# Patient Record
Sex: Female | Born: 1935 | Race: White | Hispanic: No
Health system: Southern US, Community
[De-identification: ages and names within clinical notes are randomized; demographics above are authoritative.]

---

## 2004-08-23 ENCOUNTER — Ambulatory Visit: Payer: Self-pay | Admitting: Internal Medicine

## 2005-01-10 ENCOUNTER — Ambulatory Visit: Payer: Self-pay | Admitting: Gastroenterology

## 2006-04-23 ENCOUNTER — Ambulatory Visit: Payer: Self-pay | Admitting: Nephrology

## 2009-11-03 ENCOUNTER — Observation Stay: Payer: Self-pay | Admitting: Specialist

## 2009-11-18 ENCOUNTER — Ambulatory Visit: Payer: Self-pay | Admitting: Internal Medicine

## 2013-10-02 LAB — CBC WITH DIFFERENTIAL/PLATELET
BASOS PCT: 0.9 %
Basophil #: 0 10*3/uL (ref 0.0–0.1)
Eosinophil #: 0.1 10*3/uL (ref 0.0–0.7)
Eosinophil %: 1.4 %
HCT: 34.6 % — AB (ref 35.0–47.0)
HGB: 11.2 g/dL — AB (ref 12.0–16.0)
LYMPHS ABS: 0.7 10*3/uL — AB (ref 1.0–3.6)
LYMPHS PCT: 13.1 %
MCH: 31.3 pg (ref 26.0–34.0)
MCHC: 32.4 g/dL (ref 32.0–36.0)
MCV: 97 fL (ref 80–100)
MONO ABS: 0.5 x10 3/mm (ref 0.2–0.9)
MONOS PCT: 9 %
Neutrophil #: 4.3 10*3/uL (ref 1.4–6.5)
Neutrophil %: 75.6 %
Platelet: 262 10*3/uL (ref 150–440)
RBC: 3.58 10*6/uL — ABNORMAL LOW (ref 3.80–5.20)
RDW: 15.3 % — ABNORMAL HIGH (ref 11.5–14.5)
WBC: 5.7 10*3/uL (ref 3.6–11.0)

## 2013-10-02 LAB — COMPREHENSIVE METABOLIC PANEL
ALK PHOS: 109 U/L
Albumin: 3.6 g/dL (ref 3.4–5.0)
Anion Gap: 8 (ref 7–16)
BILIRUBIN TOTAL: 0.4 mg/dL (ref 0.2–1.0)
BUN: 59 mg/dL — AB (ref 7–18)
CALCIUM: 7.8 mg/dL — AB (ref 8.5–10.1)
CHLORIDE: 118 mmol/L — AB (ref 98–107)
Co2: 15 mmol/L — ABNORMAL LOW (ref 21–32)
Creatinine: 2.88 mg/dL — ABNORMAL HIGH (ref 0.60–1.30)
EGFR (Non-African Amer.): 13 — ABNORMAL LOW
GFR CALC AF AMER: 15 — AB
Glucose: 93 mg/dL (ref 65–99)
OSMOLALITY: 297 (ref 275–301)
POTASSIUM: 4.9 mmol/L (ref 3.5–5.1)
SGOT(AST): 23 U/L (ref 15–37)
SGPT (ALT): 17 U/L (ref 12–78)
Sodium: 141 mmol/L (ref 136–145)
Total Protein: 7.8 g/dL (ref 6.4–8.2)

## 2013-10-02 LAB — PRO B NATRIURETIC PEPTIDE: B-TYPE NATIURETIC PEPTID: 13684 pg/mL — AB (ref 0–450)

## 2013-10-03 ENCOUNTER — Observation Stay: Payer: Self-pay | Admitting: Internal Medicine

## 2013-12-01 ENCOUNTER — Emergency Department: Payer: Self-pay | Admitting: Emergency Medicine

## 2013-12-01 LAB — BASIC METABOLIC PANEL
Anion Gap: 13 (ref 7–16)
BUN: 52 mg/dL — AB (ref 7–18)
Calcium, Total: 8.3 mg/dL — ABNORMAL LOW (ref 8.5–10.1)
Chloride: 112 mmol/L — ABNORMAL HIGH (ref 98–107)
Co2: 11 mmol/L — ABNORMAL LOW (ref 21–32)
Creatinine: 2.87 mg/dL — ABNORMAL HIGH (ref 0.60–1.30)
EGFR (Non-African Amer.): 13 — ABNORMAL LOW
GFR CALC AF AMER: 16 — AB
Glucose: 100 mg/dL — ABNORMAL HIGH (ref 65–99)
Osmolality: 286 (ref 275–301)
POTASSIUM: 4.2 mmol/L (ref 3.5–5.1)
Sodium: 136 mmol/L (ref 136–145)

## 2013-12-01 LAB — CBC
HCT: 36.7 % (ref 35.0–47.0)
HGB: 11.6 g/dL — ABNORMAL LOW (ref 12.0–16.0)
MCH: 30.6 pg (ref 26.0–34.0)
MCHC: 31.6 g/dL — AB (ref 32.0–36.0)
MCV: 97 fL (ref 80–100)
PLATELETS: 182 10*3/uL (ref 150–440)
RBC: 3.79 10*6/uL — ABNORMAL LOW (ref 3.80–5.20)
RDW: 16 % — AB (ref 11.5–14.5)
WBC: 3.9 10*3/uL (ref 3.6–11.0)

## 2013-12-01 LAB — PRO B NATRIURETIC PEPTIDE: B-TYPE NATIURETIC PEPTID: 18578 pg/mL — AB (ref 0–450)

## 2013-12-01 LAB — TROPONIN I: Troponin-I: <0.02 ng/mL

## 2014-07-13 DIAGNOSIS — I150 Renovascular hypertension: Secondary | ICD-10-CM | POA: Diagnosis not present

## 2014-07-13 DIAGNOSIS — K219 Gastro-esophageal reflux disease without esophagitis: Secondary | ICD-10-CM | POA: Diagnosis not present

## 2014-07-13 DIAGNOSIS — E039 Hypothyroidism, unspecified: Secondary | ICD-10-CM | POA: Diagnosis not present

## 2014-07-13 DIAGNOSIS — E871 Hypo-osmolality and hyponatremia: Secondary | ICD-10-CM | POA: Diagnosis not present

## 2014-07-13 DIAGNOSIS — E782 Mixed hyperlipidemia: Secondary | ICD-10-CM | POA: Diagnosis not present

## 2014-07-13 DIAGNOSIS — K5909 Other constipation: Secondary | ICD-10-CM | POA: Diagnosis not present

## 2014-07-13 DIAGNOSIS — Z0001 Encounter for general adult medical examination with abnormal findings: Secondary | ICD-10-CM | POA: Diagnosis not present

## 2014-09-07 ENCOUNTER — Ambulatory Visit
Admit: 2014-09-07 | Disposition: A | Discharge status: Home / Self Care | Payer: Self-pay | Attending: Internal Medicine | Admitting: Internal Medicine

## 2014-09-30 ENCOUNTER — Inpatient Hospital Stay: Payer: Self-pay | Admitting: Internal Medicine

## 2014-09-30 DIAGNOSIS — E876 Hypokalemia: Secondary | ICD-10-CM | POA: Diagnosis not present

## 2014-09-30 DIAGNOSIS — R748 Abnormal levels of other serum enzymes: Secondary | ICD-10-CM | POA: Diagnosis not present

## 2014-09-30 DIAGNOSIS — N170 Acute kidney failure with tubular necrosis: Secondary | ICD-10-CM | POA: Diagnosis not present

## 2014-09-30 DIAGNOSIS — E43 Unspecified severe protein-calorie malnutrition: Secondary | ICD-10-CM | POA: Diagnosis not present

## 2014-09-30 DIAGNOSIS — I120 Hypertensive chronic kidney disease with stage 5 chronic kidney disease or end stage renal disease: Secondary | ICD-10-CM | POA: Diagnosis not present

## 2014-09-30 DIAGNOSIS — R197 Diarrhea, unspecified: Secondary | ICD-10-CM | POA: Diagnosis not present

## 2014-09-30 DIAGNOSIS — N185 Chronic kidney disease, stage 5: Secondary | ICD-10-CM | POA: Diagnosis not present

## 2014-09-30 DIAGNOSIS — R188 Other ascites: Secondary | ICD-10-CM | POA: Diagnosis not present

## 2014-09-30 DIAGNOSIS — Z66 Do not resuscitate: Secondary | ICD-10-CM | POA: Diagnosis not present

## 2014-09-30 DIAGNOSIS — N179 Acute kidney failure, unspecified: Secondary | ICD-10-CM | POA: Diagnosis not present

## 2014-09-30 DIAGNOSIS — E538 Deficiency of other specified B group vitamins: Secondary | ICD-10-CM | POA: Diagnosis not present

## 2014-09-30 DIAGNOSIS — L03116 Cellulitis of left lower limb: Secondary | ICD-10-CM | POA: Diagnosis not present

## 2014-09-30 DIAGNOSIS — I340 Nonrheumatic mitral (valve) insufficiency: Secondary | ICD-10-CM | POA: Diagnosis not present

## 2014-09-30 DIAGNOSIS — I4891 Unspecified atrial fibrillation: Secondary | ICD-10-CM | POA: Diagnosis not present

## 2014-09-30 DIAGNOSIS — R531 Weakness: Secondary | ICD-10-CM | POA: Diagnosis not present

## 2014-09-30 DIAGNOSIS — K219 Gastro-esophageal reflux disease without esophagitis: Secondary | ICD-10-CM | POA: Diagnosis not present

## 2014-09-30 DIAGNOSIS — R911 Solitary pulmonary nodule: Secondary | ICD-10-CM | POA: Diagnosis not present

## 2014-09-30 DIAGNOSIS — Z515 Encounter for palliative care: Secondary | ICD-10-CM | POA: Diagnosis not present

## 2014-09-30 DIAGNOSIS — M7989 Other specified soft tissue disorders: Secondary | ICD-10-CM | POA: Diagnosis not present

## 2014-09-30 DIAGNOSIS — I739 Peripheral vascular disease, unspecified: Secondary | ICD-10-CM | POA: Diagnosis not present

## 2014-09-30 DIAGNOSIS — E860 Dehydration: Secondary | ICD-10-CM | POA: Diagnosis not present

## 2014-09-30 DIAGNOSIS — I272 Other secondary pulmonary hypertension: Secondary | ICD-10-CM | POA: Diagnosis not present

## 2014-09-30 DIAGNOSIS — I129 Hypertensive chronic kidney disease with stage 1 through stage 4 chronic kidney disease, or unspecified chronic kidney disease: Secondary | ICD-10-CM | POA: Diagnosis not present

## 2014-09-30 DIAGNOSIS — N390 Urinary tract infection, site not specified: Secondary | ICD-10-CM | POA: Diagnosis not present

## 2014-09-30 DIAGNOSIS — I480 Paroxysmal atrial fibrillation: Secondary | ICD-10-CM | POA: Diagnosis not present

## 2014-09-30 DIAGNOSIS — F419 Anxiety disorder, unspecified: Secondary | ICD-10-CM | POA: Diagnosis not present

## 2014-09-30 DIAGNOSIS — F0390 Unspecified dementia without behavioral disturbance: Secondary | ICD-10-CM | POA: Diagnosis not present

## 2014-09-30 DIAGNOSIS — R6520 Severe sepsis without septic shock: Secondary | ICD-10-CM | POA: Diagnosis not present

## 2014-09-30 DIAGNOSIS — N186 End stage renal disease: Secondary | ICD-10-CM | POA: Diagnosis not present

## 2014-09-30 DIAGNOSIS — A09 Infectious gastroenteritis and colitis, unspecified: Secondary | ICD-10-CM | POA: Diagnosis not present

## 2014-09-30 DIAGNOSIS — N189 Chronic kidney disease, unspecified: Secondary | ICD-10-CM | POA: Diagnosis not present

## 2014-09-30 DIAGNOSIS — E872 Acidosis: Secondary | ICD-10-CM | POA: Diagnosis not present

## 2014-09-30 DIAGNOSIS — K529 Noninfective gastroenteritis and colitis, unspecified: Secondary | ICD-10-CM | POA: Diagnosis not present

## 2014-09-30 DIAGNOSIS — E039 Hypothyroidism, unspecified: Secondary | ICD-10-CM | POA: Diagnosis not present

## 2014-09-30 DIAGNOSIS — L03115 Cellulitis of right lower limb: Secondary | ICD-10-CM | POA: Diagnosis not present

## 2014-09-30 DIAGNOSIS — Z681 Body mass index (BMI) 19 or less, adult: Secondary | ICD-10-CM | POA: Diagnosis not present

## 2014-09-30 DIAGNOSIS — A419 Sepsis, unspecified organism: Secondary | ICD-10-CM | POA: Diagnosis not present

## 2014-09-30 LAB — COMPREHENSIVE METABOLIC PANEL
ANION GAP: 10 (ref 7–16)
Albumin: 3 g/dL — ABNORMAL LOW
Alkaline Phosphatase: 125 U/L
BILIRUBIN TOTAL: 2.1 mg/dL — AB
BUN: 66 mg/dL — ABNORMAL HIGH
CHLORIDE: 115 mmol/L — AB
CO2: 14 mmol/L — AB
Calcium, Total: 8.3 mg/dL — ABNORMAL LOW
Creatinine: 3.74 mg/dL — ABNORMAL HIGH
EGFR (African American): 11 — ABNORMAL LOW
EGFR (Non-African Amer.): 10 — ABNORMAL LOW
GLUCOSE: 77 mg/dL
POTASSIUM: 4.4 mmol/L
SGOT(AST): 31 U/L
SGPT (ALT): 37 U/L
SODIUM: 139 mmol/L
Total Protein: 6.3 g/dL — ABNORMAL LOW

## 2014-09-30 LAB — CBC
HCT: 40.4 % (ref 35.0–47.0)
HGB: 13.2 g/dL (ref 12.0–16.0)
MCH: 30.9 pg (ref 26.0–34.0)
MCHC: 32.6 g/dL (ref 32.0–36.0)
MCV: 95 fL (ref 80–100)
PLATELETS: 153 10*3/uL (ref 150–440)
RBC: 4.26 10*6/uL (ref 3.80–5.20)
RDW: 16.3 % — AB (ref 11.5–14.5)
WBC: 4 10*3/uL (ref 3.6–11.0)

## 2014-09-30 LAB — TSH: Thyroid Stimulating Horm: 4.858 u[IU]/mL — ABNORMAL HIGH

## 2014-09-30 LAB — URINALYSIS, COMPLETE
BILIRUBIN, UR: NEGATIVE
Glucose,UR: NEGATIVE mg/dL (ref 0–75)
Ketone: NEGATIVE
NITRITE: NEGATIVE
Ph: 6 (ref 4.5–8.0)
RBC,UR: 1 /HPF (ref 0–5)
Specific Gravity: 1.012 (ref 1.003–1.030)
Squamous Epithelial: 4
WBC UR: 32 /HPF (ref 0–5)

## 2014-09-30 LAB — TROPONIN I: Troponin-I: 0.09 ng/mL — ABNORMAL HIGH

## 2014-10-01 DIAGNOSIS — I340 Nonrheumatic mitral (valve) insufficiency: Secondary | ICD-10-CM | POA: Diagnosis not present

## 2014-10-01 LAB — CBC WITH DIFFERENTIAL/PLATELET
Basophil #: 0 10*3/uL (ref 0.0–0.1)
Basophil %: 0.8 %
EOS PCT: 0.6 %
Eosinophil #: 0 10*3/uL (ref 0.0–0.7)
HCT: 37.1 % (ref 35.0–47.0)
HGB: 11.5 g/dL — AB (ref 12.0–16.0)
LYMPHS ABS: 0.4 10*3/uL — AB (ref 1.0–3.6)
Lymphocyte %: 12 %
MCH: 30.2 pg (ref 26.0–34.0)
MCHC: 31 g/dL — ABNORMAL LOW (ref 32.0–36.0)
MCV: 98 fL (ref 80–100)
MONO ABS: 0.5 x10 3/mm (ref 0.2–0.9)
Monocyte %: 14 %
Neutrophil #: 2.7 10*3/uL (ref 1.4–6.5)
Neutrophil %: 72.6 %
Platelet: 126 10*3/uL — ABNORMAL LOW (ref 150–440)
RBC: 3.8 10*6/uL (ref 3.80–5.20)
RDW: 17.2 % — ABNORMAL HIGH (ref 11.5–14.5)
WBC: 3.7 10*3/uL (ref 3.6–11.0)

## 2014-10-01 LAB — BASIC METABOLIC PANEL
ANION GAP: 10 (ref 7–16)
Anion Gap: 11 (ref 7–16)
BUN: 70 mg/dL — ABNORMAL HIGH
BUN: 71 mg/dL — ABNORMAL HIGH
CALCIUM: 7.5 mg/dL — AB
CALCIUM: 7.7 mg/dL — AB
CHLORIDE: 115 mmol/L — AB
CHLORIDE: 117 mmol/L — AB
CO2: 12 mmol/L — AB
Co2: 10 mmol/L — CL
Creatinine: 3.73 mg/dL — ABNORMAL HIGH
Creatinine: 3.71 mg/dL — ABNORMAL HIGH
EGFR (African American): 11 — ABNORMAL LOW
EGFR (Non-African Amer.): 10 — ABNORMAL LOW
GFR CALC AF AMER: 11 — AB
GFR CALC NON AF AMER: 10 — AB
GLUCOSE: 93 mg/dL
Glucose: 90 mg/dL
POTASSIUM: 4.3 mmol/L
Potassium: 4.7 mmol/L
Sodium: 135 mmol/L
Sodium: 140 mmol/L

## 2014-10-01 LAB — HEPATIC FUNCTION PANEL A (ARMC)
ALK PHOS: 101 U/L
Albumin: 2.5 g/dL — ABNORMAL LOW
Bilirubin,Total: 1 mg/dL
SGOT(AST): 29 U/L
SGPT (ALT): 28 U/L
Total Protein: 5.3 g/dL — ABNORMAL LOW

## 2014-10-01 LAB — TSH

## 2014-10-01 LAB — TROPONIN I: Troponin-I: 0.08 ng/mL — ABNORMAL HIGH

## 2014-10-01 LAB — T4, FREE: Free Thyroxine: 0.78 ng/dL

## 2014-10-01 LAB — LACTIC ACID, PLASMA: LACTIC ACID, VENOUS: 2.2 mmol/L — AB

## 2014-10-02 LAB — CBC WITH DIFFERENTIAL/PLATELET
BASOS ABS: 0 10*3/uL (ref 0.0–0.1)
Basophil %: 0.4 %
EOS PCT: 0.5 %
Eosinophil #: 0 10*3/uL (ref 0.0–0.7)
HCT: 33.7 % — ABNORMAL LOW (ref 35.0–47.0)
HGB: 11 g/dL — ABNORMAL LOW (ref 12.0–16.0)
LYMPHS ABS: 0.4 10*3/uL — AB (ref 1.0–3.6)
Lymphocyte %: 10.1 %
MCH: 30.5 pg (ref 26.0–34.0)
MCHC: 32.6 g/dL (ref 32.0–36.0)
MCV: 93 fL (ref 80–100)
MONO ABS: 0.5 x10 3/mm (ref 0.2–0.9)
Monocyte %: 12.4 %
Neutrophil #: 3.3 10*3/uL (ref 1.4–6.5)
Neutrophil %: 76.6 %
Platelet: 138 10*3/uL — ABNORMAL LOW (ref 150–440)
RBC: 3.6 10*6/uL — ABNORMAL LOW (ref 3.80–5.20)
RDW: 16 % — ABNORMAL HIGH (ref 11.5–14.5)
WBC: 4.4 10*3/uL (ref 3.6–11.0)

## 2014-10-02 LAB — BASIC METABOLIC PANEL
ANION GAP: 11 (ref 7–16)
BUN: 65 mg/dL — ABNORMAL HIGH
Calcium, Total: 7 mg/dL — CL
Chloride: 105 mmol/L
Co2: 19 mmol/L — ABNORMAL LOW
Creatinine: 3.57 mg/dL — ABNORMAL HIGH
GFR CALC AF AMER: 12 — AB
GFR CALC NON AF AMER: 10 — AB
GLUCOSE: 118 mg/dL — AB
Potassium: 3.4 mmol/L — ABNORMAL LOW
Sodium: 135 mmol/L

## 2014-10-02 LAB — URINE CULTURE

## 2014-10-02 LAB — ALBUMIN: ALBUMIN: 2.5 g/dL — AB

## 2014-10-03 LAB — BASIC METABOLIC PANEL
ANION GAP: 10 (ref 7–16)
BUN: 58 mg/dL — AB
CO2: 21 mmol/L — AB
Calcium, Total: 6.8 mg/dL — CL
Chloride: 99 mmol/L — ABNORMAL LOW
Creatinine: 3.24 mg/dL — ABNORMAL HIGH
EGFR (Non-African Amer.): 11 — ABNORMAL LOW
GFR CALC AF AMER: 13 — AB
Glucose: 129 mg/dL — ABNORMAL HIGH
Potassium: 3.7 mmol/L
SODIUM: 130 mmol/L — AB

## 2014-10-03 LAB — CLOSTRIDIUM DIFFICILE(ARMC)

## 2014-10-04 LAB — BASIC METABOLIC PANEL
Anion Gap: 13 (ref 7–16)
BUN: 58 mg/dL — AB
Calcium, Total: 7.1 mg/dL — ABNORMAL LOW
Chloride: 99 mmol/L — ABNORMAL LOW
Co2: 19 mmol/L — ABNORMAL LOW
Creatinine: 3.42 mg/dL — ABNORMAL HIGH
EGFR (Non-African Amer.): 11 — ABNORMAL LOW
GFR CALC AF AMER: 12 — AB
Glucose: 87 mg/dL
Potassium: 3.9 mmol/L
Sodium: 131 mmol/L — ABNORMAL LOW

## 2014-10-04 LAB — CBC WITH DIFFERENTIAL/PLATELET
BASOS PCT: 0.4 %
Basophil #: 0 10*3/uL (ref 0.0–0.1)
EOS PCT: 1 %
Eosinophil #: 0 10*3/uL (ref 0.0–0.7)
HCT: 37.2 % (ref 35.0–47.0)
HGB: 11.9 g/dL — AB (ref 12.0–16.0)
LYMPHS PCT: 10.6 %
Lymphocyte #: 0.5 10*3/uL — ABNORMAL LOW (ref 1.0–3.6)
MCH: 30 pg (ref 26.0–34.0)
MCHC: 32.1 g/dL (ref 32.0–36.0)
MCV: 94 fL (ref 80–100)
MONOS PCT: 10.2 %
Monocyte #: 0.5 x10 3/mm (ref 0.2–0.9)
NEUTROS PCT: 77.8 %
Neutrophil #: 3.7 10*3/uL (ref 1.4–6.5)
Platelet: 154 10*3/uL (ref 150–440)
RBC: 3.98 10*6/uL (ref 3.80–5.20)
RDW: 16 % — ABNORMAL HIGH (ref 11.5–14.5)
WBC: 4.7 10*3/uL (ref 3.6–11.0)

## 2014-10-05 LAB — STOOL CULTURE

## 2014-10-06 LAB — CULTURE, BLOOD (SINGLE)

## 2014-10-08 ENCOUNTER — Ambulatory Visit
Admit: 2014-10-08 | Disposition: A | Discharge status: Home / Self Care | Payer: Self-pay | Attending: Internal Medicine | Admitting: Internal Medicine

## 2014-10-30 NOTE — Discharge Summary (Signed)
PATIENT NAME:  Ruth Browning, Ruth Browning DATE OF BIRTH:  April 27, 1916  DATE OF ADMISSION:  10/03/2013 DATE OF DISCHARGE:  10/04/2013  For a detailed note, please take a look at the history and physical done on admission by Dr. Angelica Ranavid Hower.   DIAGNOSES AT DISCHARGE: Is as follows: Lower extremity edema, likely dependent edema. Cellulitis. Hypothyroidism, chronic kidney disease stage III. Gastroesophageal reflux disease.  DIET: The patient is being discharged on a regular diet.   ACTIVITY: As tolerated.   FOLLOWUP: With Dr. Beverely RisenFozia Khan in the next 1 to 2 weeks.   The patient is being discharged with home health nursing and physical therapy services.   DISCHARGE MEDICATIONS: Vitamin B12 at 2500 mcg sublingual tablet 2 tabs daily, lansoprazole 30 mg daily, Synthroid 75 mcg daily, clindamycin 300 mg q. 8 hours for 7 days.   LABORATORY DATA: Pertinent studies done during the hospital course are as follows: A chest x-ray of the ankle done on admission showing no acute fracture osteopenia. Dopplers of the lower extremities done bilaterally showing no evidence of any DVT.  HOSPITAL COURSE: This is a 81 year old female who presented to the hospital with bilateral lower extremity edema and some pain in her lower extremities.  1. Lower extremity edema. The most likely cause of this is multifactorial, but probably related to deconditioning, poor mobility, and a possible underlying cellulitis. There was some concern for underlying deep vein thrombosis; therefore, the patient underwent Dopplers of her lower extremities, which were negative though. She was empirically started on oral clindamycin for her cellulitis which she has tolerated well. She cannot take penicillin as she has anaphylaxis to it. At this point, I am discharging her on oral clindamycin for underlying cellulitis. Her swelling and her pain in her lower extremities have improved since admission.  2. Hypothyroidism. The patient was  maintained on her Synthroid. She will resume that. 3. GERD. The patient was maintained on her Protonix. She will resume that. 4. Chronic kidney disease, stage III. Her creatinine remained at baseline. This further needs to be followed as an outpatient.  5. Cellulitis. As mentioned, this was on her left ankle. She was started on oral clindamycin. I am discharging her on 7 more days of antibiotic therapy.   CODE STATUS: The patient is a DO NOT INTUBATE/DO NOT RESUSCITATE.   She was evaluated by physical therapy and thought she would benefit from rehab although she does not want to go to rehab. Therefore, she is being arranged for home health nursing and physical therapy services. The patient is a DNI/DNR as mentioned.  TIME SPENT: 40 minutes.    ____________________________ Rolly PancakeVivek J. Cherlynn KaiserSainani, MD vjs:lt D: 10/04/2013 13:29:43 ET T: 10/05/2013 02:16:41 ET JOB#: [id number]  cc: Rolly PancakeVivek J. Cherlynn KaiserSainani, MD, <Dictator> [name], MD [place] SirenVIVEK J Alaysiah Browder MD ELECTRONICALLY SIGNED 10/07/2013 14:51

## 2014-10-30 NOTE — H&P (Signed)
PATIENT NAME:  Ruth Browning MR#:  [id number] DATE OF BIRTH:  [birth year]  DATE OF ADMISSION:  10/03/2013  REFERRING PHYSICIAN: Dr. Shaune PollackLord.   PRIMARY CARE PHYSICIAN: Ruth RisenFozia Khan, MD  CHIEF COMPLAINT: Leg pain and swelling.  HISTORY OF PRESENT ILLNESS: A 81 year old Caucasian with a past history of chronic kidney disease with a baseline creatinine of about 2.3 approximately 4 years ago presenting with lower extremity edema. She describes 1-week duration of lower extremity edema, left worse than right which has been progressively worsening and associated pain which she described only as "pain". Intensity 6/10, worse with walking. No relieving factors. No radiation, No further symptoms. Denies any shortness of breath, palpitations, chest pain, fevers, or chills.   REVIEW OF SYSTEMS:  CONSTITUTIONAL: Denies fever, fatigue, weakness. EYES: Denies blurry, double vision, or eye pain.  HEENT: Denies tinnitus, ear pain, hearing loss.  RESPIRATORY: Denies cough, wheeze, shortness of breath. CARDIOVASCULAR: Denies chest pain, palpitations, orthopnea, positive for edema.  GASTROINTESTINAL: Denies nausea, vomiting, diarrhea, or abdominal pain.  GENITOURINARY: Denies dysuria or hematuria.  ENDOCRINE: Denies nocturia or thyroid problems.  HEMATOLOGIC AND LYMPHATIC: Positive for easy bruising. Denies bleeding. SKIN: Positive for a res rash on left lower extremity of 1-day duration which is warm. No further lesions.  MUSCULOSKELETAL: Positive for pain in the lower extremities as described above. Denies any pain in her neck, back, shoulders, knees, or hips. Denies arthritic symptoms.   NEUROLOGIC: Denies paralysis or paresthesias.  PSYCHIATRIC: Denies anxiety or depressive symptoms.   Otherwise, full review of systems performed by me is negative.   PAST MEDICAL HISTORY: Hypertension, hypothyroidism, chronic kidney disease with last known creatinine of 2.3.   SOCIAL HISTORY: Denies alcohol, tobacco or drug  usage.   FAMILY HISTORY: Denies any known history of cardiovascular illness.   ALLERGIES: PENICILLIN, WHICH CAUSES ANAPHYLACTIC REACTION.   HOME MEDICATIONS: Lansoprazole 30 mg p.o. daily, Synthroid 75 mcg p.o. daily, vitamin B12 at 2500 mcg 2 tablets daily.   PHYSICAL EXAMINATION:  VITAL SIGNS: Temperature 97.7, heart rate 81, respirations 18, blood pressure 151/77, saturating 99% on room air. Weight 38.6 kg. BMI 17.2.  GENERAL: Frail-appearing Caucasian female, currently in no acute distress.  HEAD: Normocephalic, atraumatic.  EYES: Pupils equal, round, reactive to light. Extraocular muscles intact. No scleral icterus.  MOUTH: Moist mucous membranes.  Dentition intact. No abscess noted.  EARS, NOSE, AND THROAT: Clear without exudates. No external lesions.  NECK: Supple. No thyromegaly. No nodules. No JVD.  PULMONARY: Clear to auscultation bilaterally without wheezes, rales, or rhonchi. No use of accessory muscles. Good respiratory effort.  CHEST:  Nontender to palpation.  CARDIOVASCULAR: S1, S2, regular rate and rhythm. No murmurs, rubs, or gallops, 2+ edema bilateral lower extremities, left worse than right. Pedal pulses are 2+ bilaterally.   GASTROINTESTINAL: Soft, nontender, nondistended. No masses. Positive bowel sounds. No hepatosplenomegaly.  MUSCULOSKELETAL: No swelling or clubbing. Positive for edema as described above. Range of motion is full in all extremities. NEUROLOGIC: Cranial nerves II through XII intact. No gross focal neurologic deficits. Sensation intact. Reflexes intact.  SKIN: No ulcerations. Her left lower extremity is grossly erythematous, which is warm to touch over the entire shin. No cyanosis. Skin warm and dry and turgor intact.  PSYCHIATRIC: Mood and affect within normal limits. The patient alert and oriented x 3. Insight and judgment intact.   LABORATORY DATA: Sodium 141, potassium 4.9, chloride 118, bicarbonate 15, anion gap of 8, BUN of 59, creatinine 2.88,  glucose 93. BNP 13,684. Calcium 7.8.  Her LFTs are within normal limits. WBC 5.7, hemoglobin 11.2, platelets 262,000.   ASSESSMENT AND PLAN: A 81 year old Caucasian female with history of chronic kidney disease with a baseline creatinine around 2.3% presenting with lower extremity edema. 1. Lower extremity edema and cellulitis throughout the left lower extremity. Given her penicillin allergy, we will place her on clindamycin. An ultrasound is pending at this time to rule out a deep vein thrombosis. She has no further symptoms suggestive of congestive heart failure.  2. Acute kidney injury on chronic kidney disease. This is likely not far from her baseline and we will provide gentle IV fluid hydration. Follow renal function and urine output.  3. Non anion gap metabolic acidosis in the setting of renal failure.  4. Hypothyroidism. Continue Synthroid.  5. Venous thromboembolism prophylaxis with heparin subcutaneous.   TIME SPENT: 45 minutes.     ____________________________ Cletis Athens. Hower, MD dkh:lt D: 10/03/2013 00:34:46 ET T: 10/03/2013 01:50:11 ET JOB#: [id number]  cc: Cletis Athens. Hower, MD, <Dictator> DAVID Synetta Shadow MD ELECTRONICALLY SIGNED 10/04/2013 2:58

## 2014-11-07 NOTE — Discharge Summary (Signed)
PATIENT NAME:  Ruth Browning, Ruth Browning MR#:  [id number] DATE OF BIRTH:  1916-07-06  DATE OF ADMISSION:  09/30/2014 DATE OF DISCHARGE:  10/04/2014  For a detailed office, please take a look at the history and physical done on admission by Dr. Luberta Mutter.    DIAGNOSES AT DISCHARGE:  1.  Acute on chronic renal failure.  2.  Sepsis secondary to infectious diarrhea. 3.  Metabolic acidosis, now resolved. 4.  Advanced dementia. 5.  Anxiety.   DISPOSITION:  The patient is being discharged to hospice home.   DIET: The patient is being discharged on a diet as tolerated.   ACTIVITY: As tolerated.   DISCHARGE MEDICATIONS: Lasix 20 mg daily as needed, lorazepam 0.5 mg 1 to 2 tabs sublingual every 2 to 4 hours as needed, morphine oral 0.25 mL every 1 to 2 hours as needed.   CONSULTANTS DURING THE HOSPITAL COURSE:  Dr. Harriett Sine Phifer palliative care,  Dr. Mosetta Pigeon nephrology.   PERTINENT STUDIES DONE DURING THE HOSPITAL COURSE:  As follows:  Chest x-ray done on admission showing no acute changes. An ultrasound of the lower extremities showing no evidence of any acute DVT.  A CT scan of the abdomen and pelvis done without contrast showing no acute focal intra-abdominal or pelvic pathology.   HOSPITAL COURSE: This is a 81 year old female with medical problems as mentioned above, presented to the hospital on 09/30/2014 due to weakness and diarrhea, and noted to be in acute on chronic renal failure.  1.  Sepsis. This was thought secondary to infectious diarrhea. The patient was treated with IV antibiotics with ciprofloxacin and Flagyl.  Her diarrhea has improved, but overall clinical status has not.  She still remains quite lethargic and weak. She has, although, been afebrile in the past 48 hours, and hemodynamically stable.  Her stool cultures for Clostridium difficile and comprehensive culture have been negative.  2.  Metabolic acidosis. This was likely secondary to the acute renal failure and severe dehydration  and diarrhea.  This has not improved.  The patient was on a bicarbonate drip but that has been discontinued.  3.  Acute on chronic renal failure. The patient's creatinine still remains elevated from her baseline, but the acidosis was corrected.  Nephrology is following the patient, but given her frail state and her advanced age, she is not a candidate for hemodialysis at this point.  4.  Lower extremity swelling.  This was probably dependent edema. The patient underwent Dopplers of her lower extremities, which were negative for any deep vein thrombosis.  5.  Acute infectious enteritis. I suspect this is probably viral in nature as her stool cultures and Clostridium difficile are negative.  She is empirically being maintained on Cipro, Flagyl.  6.  Generalized weakness.  This is secondary to her advanced age, deconditioning, and multiple comorbidities.  The patient has been seen by physical therapy.  They were recommending skilled nursing services.  7.  Anxiety. The patient is being maintained on as needed Ativan.   Given her advanced age, a poor p.o. intake, and multiple comorbidities, I did obtain a palliative care consult. The patient was seen by Dr. Harvie Junior and upon her discussions with the patient's son and family, they did not opt to continue further aggressive care and therefore decided to keep the patient comfortable at this point.  The patient is therefore being discharged to hospice home presently.    TIME SPENT: 40 minutes.     ____________________________ Rolly Pancake. Cherlynn Kaiser, MD vjs:DT D: 10/04/2014  16:38:28 ET T: 10/05/2014 08:33:29 ET JOB#: [id number]  cc: Rolly PancakeVivek J. Cherlynn KaiserSainani, MD, <Dictator> [place] SirenVIVEK J SAINANI MD ELECTRONICALLY SIGNED 10/14/2014 16:19

## 2014-11-07 NOTE — H&P (Addendum)
PATIENT NAME:  Ruth Browning, Ruth Browning MR#:  [id number] DATE OF BIRTH:  [birth year]  DATE OF ADMISSION:  09/30/2014  PRIMARY CARE PHYSICIAN:  Ruth RisenFozia Khan, MD   EMERGENCY ROOM PHYSICIAN: Ruth Ramusavid W. Kaminski, MD   CHIEF COMPLAINT: Weakness and diarrhea.   HISTORY OF PRESENT ILLNESS: The patient is a 81 year old female who lives by herself, brought in by her son because of weakness and diarrhea. The patient has been having diarrhea for about 2 days, associated with multiple episodes of diarrhea. The patient unable to get to the bedside commode, associated with significant weakness. The patient has been not eating because of her diarrhea. She is afraid that she is going to have more diarrhea, so she is not eating well or drinking well for the last 2 days. The patient lives by herself, but the son checks on her often. The son is requesting placement today. She denies any fever, no abdominal pain. No nausea, no vomiting. She is awake and oriented, denies any other complaints, except for diarrhea and decreased p.o. intake for the last 2 days. The patient has no recent travel history, did not have recent sick contacts.   PAST MEDICAL HISTORY: Significant for CKD stage III, paroxysmal atrial fibrillation, hypothyroidism,   SOCIAL HISTORY: The patient has no smoking, no drinking. Lives alone.   FAMILY HISTORY: No known family history of cardiovascular illness.  ALLERGIES: No drug allergies, but penicillin did cause an anaphylactic reaction.  PAST SURGICAL HISTORY: Right mastectomy for a  tumor.   REVIEW OF SYSTEMS:  CONSTITUTIONAL: Has no fever. Does have fatigue and weakness and ambulatory difficulties.  EYES: No blurred vision.  EARS, NOSE, AND THROAT:: No tinnitus. No ear pain.   RESPIRATIONS: No cough. No wheezing.  CARDIOVASCULAR: No chest pain. No orthopnea.  GASTROINTESTINAL: Has diarrhea for the last 2 days and decreased p.o. intake, as well.Marland Kitchen.  GENITOURINARY: No dysuria or hematuria.  HEMATOLOGIC:  No anemia.  MUSCULOSKELETAL: :Does have arthritis.  NEUROLOGIC: No numbness or strokes.  PSYCHIATRIC: No anxiety or insomnia.   MEDICATIONS AT HOME: Furosemide 20 mg daily, lansoprazole 30 mg p.o. daily, Synthroid 50 mcg p.o. daily, vitamin B12, 1000 mcg p.o. daily.   PHYSICAL EXAMINATION: VITAL SIGNS: Temperature 97.8, heart rate 87, blood pressure 124/72, saturations 94% on room air, respiratory rate 15.  GENERAL: This is a cachectic 81 year old female, very malnourished with a BMI of 16.8. Seen and the patient appears to be not in distress and able to questions appropriately.  HEAD: Normocephalic and atraumatic.  HEENT: Eyes: Pupils equal, reacting to light. No conjunctival pallor. No scleral icterus. Nose: No nasal lesions. No drainage. Ears: No drainage, are clear. Mouth: No lesions. Clinically mucosa is dry.  NECK: Supple. No JVD. No carotid bruit. Normal range of motion.  RESPIRATIONS: Good respiratory effort present. Clear to auscultation. No wheeze, no rales.   CARDIOVASCULAR: S1, S2 regular. No murmurs. The patient has no gallops. Cardiac palpation within normal limits. Pedal pulses are equal carotid, femoral. Peripheral pulse present bilaterally at dorsalis pedis.  The patient does have a chronic leg edema.  GASTROINTESTINAL: Abdomen is soft, nontender, scaphoid abdomen. No hepatosplenomegaly. Bowel sounds are present in all quadrants.  MUSCULOSKELETAL: The patient able to move all extremities x 4. The patient has the lower extremity erythema and slight tenderness present in both legs. She says that she has this chronic venous stasis dermatitis. The patient has been admitted to hospitalist service here in March of last year for lower extremity cellulitis and was given clindamycin  that time. The patient still has leg pain bilaterally, and it looks like chronic venous stasis.  PSYCHIATRIC: Mood and affect are within normal limits. She is oriented to time, place, person.  NEUROLOGIC: Alert,  awake, oriented. Cranial nerves II through XII intact. Power is 5/5 in upper and lower extremities. Sensory is intact.   LABORATORY DATA: WBC 4, hemoglobin 13.3, hematocrit 40.4, platelets 153,000. ELECTROLYTES: Sodium 139, potassium 4.4, chloride 115, bicarbonate 14, BUN 66, creatinine 3.7, glucose 77. CHEST X-RAY: No acute findings since May 2015, cardiomegaly, large hiatal hernia, an 11 mm nodule within the right mid lung. Margins are irregular this can represent small infection versus inflammatory nodule. Chest x-ray or CT is important to follow up in 6 weeks. The patient's baseline creatinine in May 2015 is 2.87. EKG shows EF with some PVCs, 92 beats per minute.   ASSESSMENT AND PLAN:  1. The patient is a 80 year old female with diarrhea, decreased p.o. intake, and generalized weakness.  2. Acute on chronic renal failure secondary to diarrhea and decreased p.o. intake.  3. Lung nodule, possible pneumonia versus mass. Right now, she is afebrile, white count is normal. Repeat another chest x-ray in about 4-6 weeks to see if it resolved. The patient is admitted to medical service.  4. Start on IV fluids with bicarbonate because of the metabolic acidosis.  5. Regarding acute on hronic renal failure: The patient is on fluids. Follow renal function > closely and hold her Lasix.  6. Regarding diarrhea: Continue IV fluids. Check the stool for Clostridium difficile. (ova,parasites, 7. Generalized weakness: The patient needs physical therapy and social worker evaluation, son is requesting placement.  8. Hypothyroidism: Continue Synthroid 50 mcg daily. Check TSH.  9.  B12 deficiency: Continue B12, 1000 mcg daily.  10. Gastroesophageal reflux disease. Continue PPIs.  11. Bilateral leg cellulitis, chronic. Start deep vein thrombosis prophylaxis with Lovenox 40 subcutaneous daily   CODE STATUS: DO NOT RESUSCITATE. Discussed this with the patient's son.   TIME SPENT: 55 minutes. Ruth Risen.     ____________________________ Ruth Hamming, MD sk:mw D: 09/30/2014 18:04:27 ET T: 09/30/2014 20:02:20 ET JOB#: [id number]  cc: Ruth Hamming, MD, <Dictator> Lyndon Code, MD Ruth Hamming MD ELECTRONICALLY SIGNED 11/05/2014 22:39

## 2014-11-07 DEATH — deceased

## 2015-05-28 IMAGING — US US EXTREM LOW VENOUS BILAT
1 series · 13 of 24 positions shown · non-contrast
Comparison: None.

CLINICAL DATA: Leg swelling.  Rule out DVT.



[Series 1: us extrem low venous bilat · 0.08mm/px · 13 of 59 slices shown]
[im 1/59]
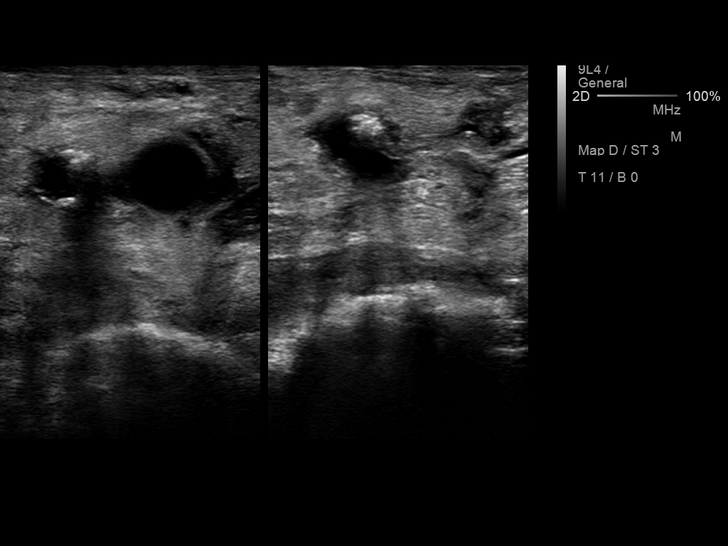
[im 6/59]
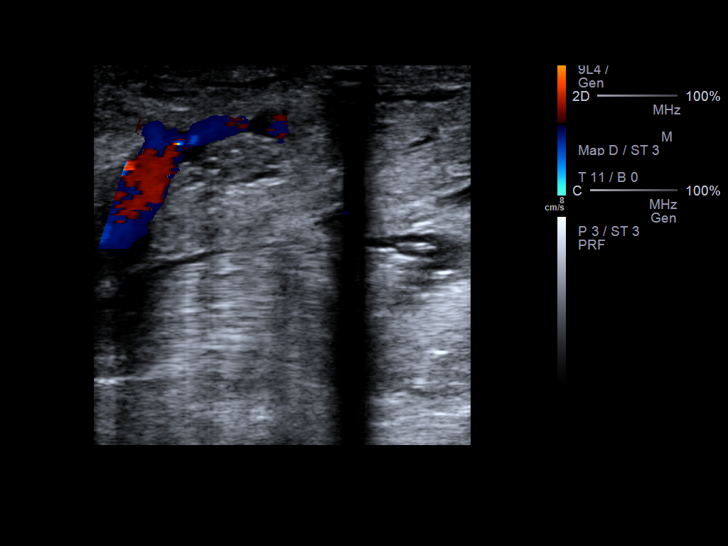
[im 11/59]
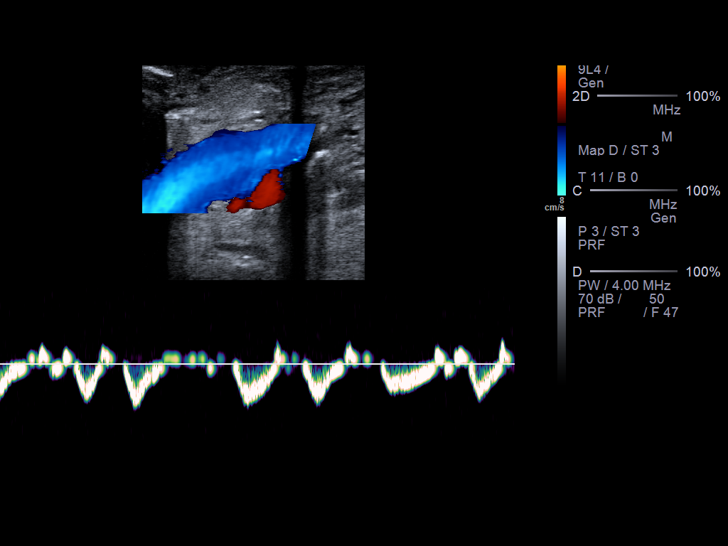
[im 16/59]
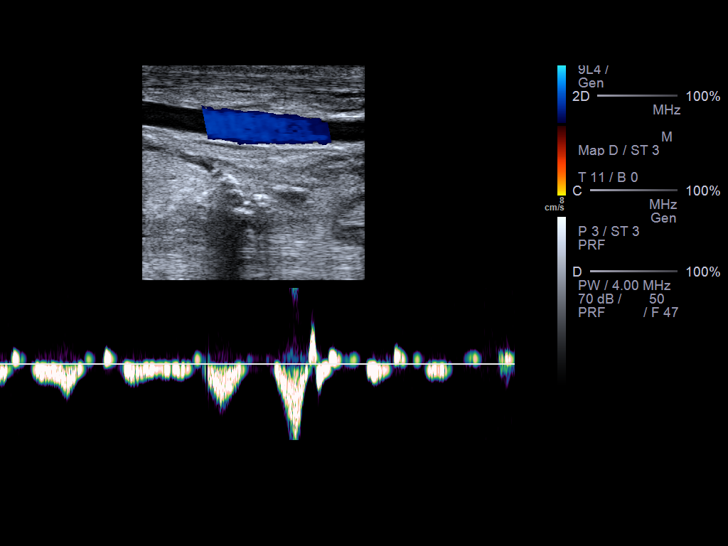
[im 21/59]
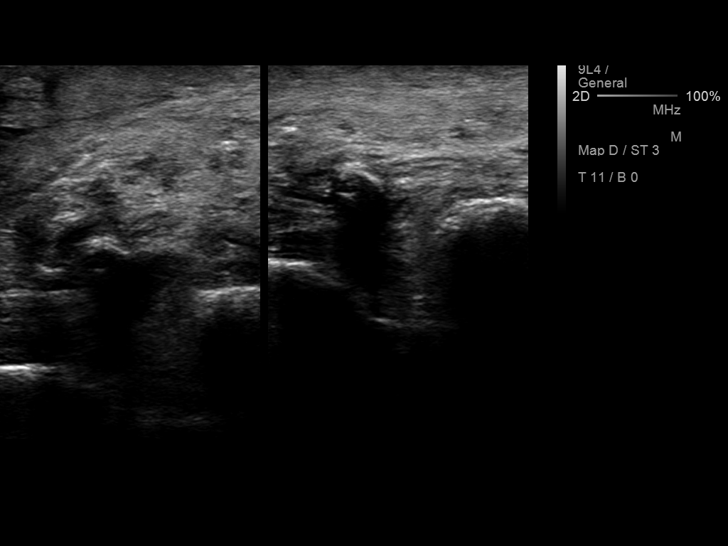
[im 26/59]
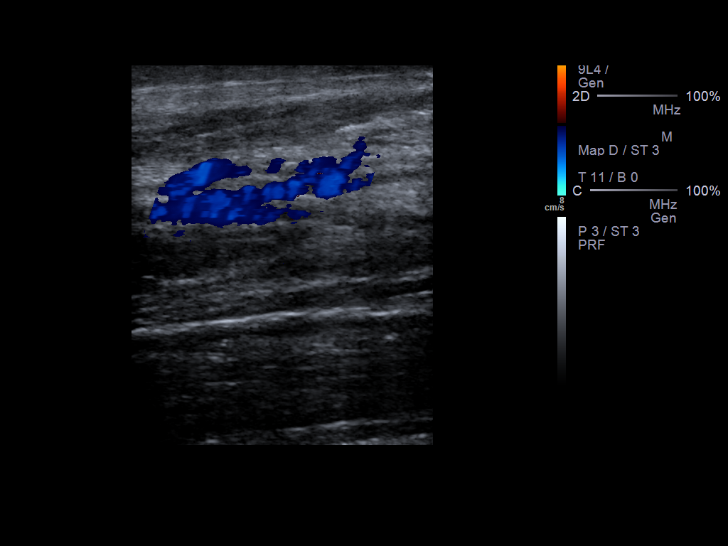
[im 31/59]
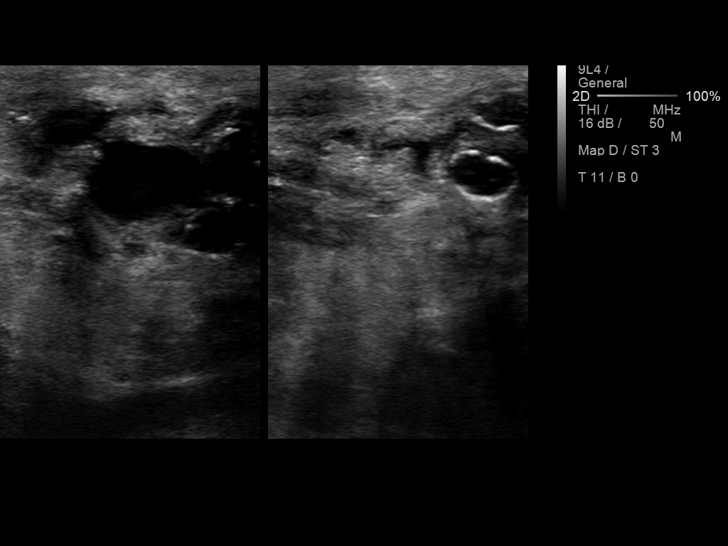
[im 33/59]
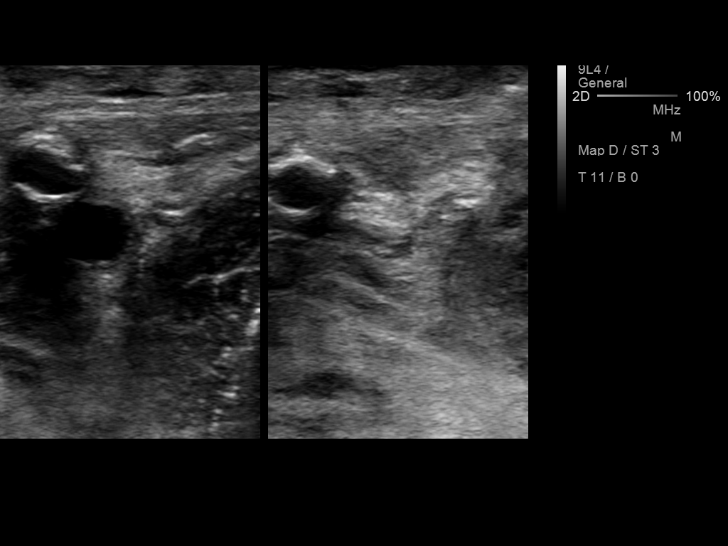
[im 38/59]
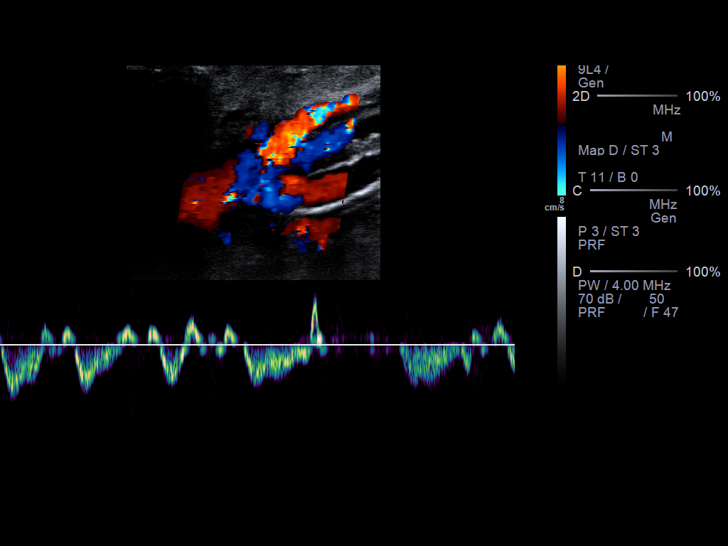
[im 43/59]
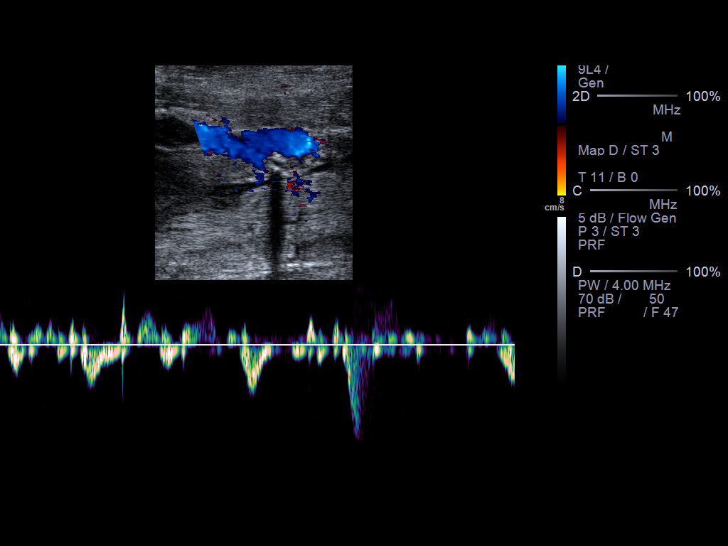
[im 48/59]
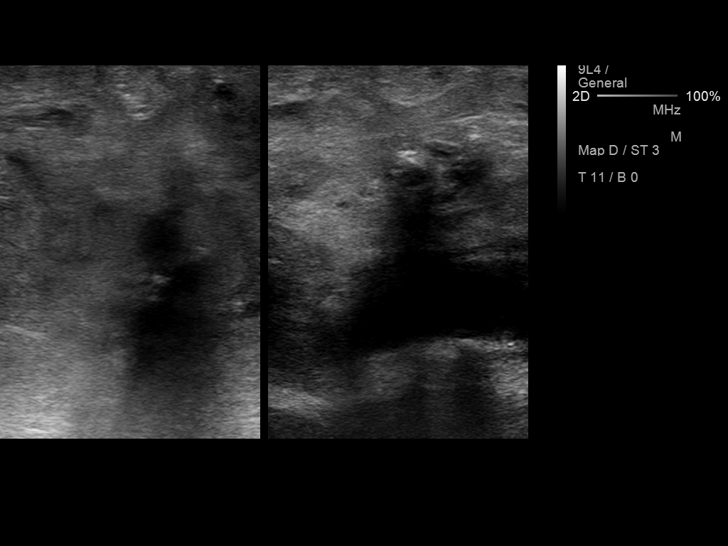
[im 53/59]
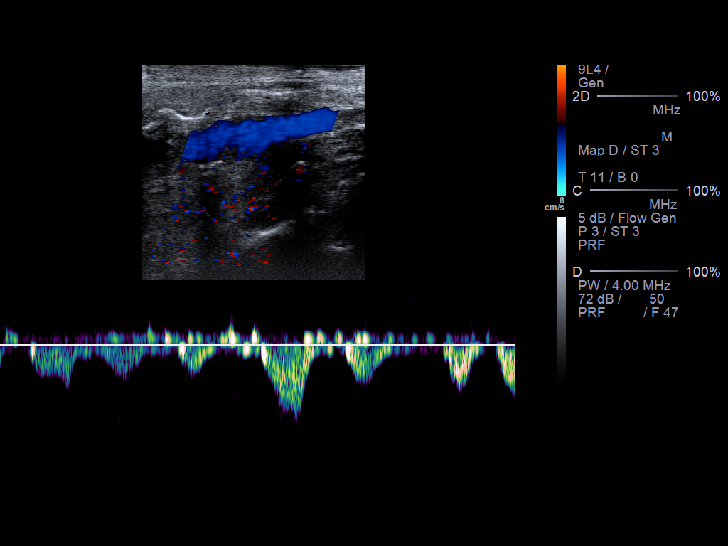
[im 59/59]
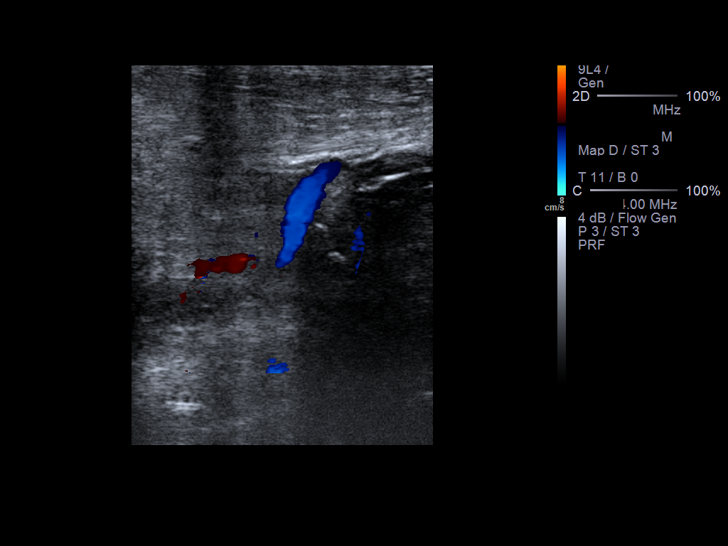

[13 of 24 positions shown; findings below may reference images not displayed]

FINDINGS: RIGHT LOWER EXTREMITY

Common Femoral Vein: No evidence of thrombus. Normal
compressibility, respiratory phasicity and response to augmentation.

Saphenofemoral Junction: No evidence of thrombus. Normal
compressibility and flow on color Doppler imaging.

Profunda Femoral Vein: No evidence of thrombus. Normal
compressibility and flow on color Doppler imaging.

Femoral Vein: No evidence of thrombus. Normal compressibility,
respiratory phasicity and response to augmentation.

Popliteal Vein: No evidence of thrombus. Normal compressibility,
respiratory phasicity and response to augmentation.

Calf Veins: No evidence of thrombus. Normal compressibility and flow
on color Doppler imaging.

Other Findings:  Arterial atherosclerotic calcifications.

LEFT LOWER EXTREMITY

Common Femoral Vein: No evidence of thrombus. Normal
compressibility, respiratory phasicity and response to augmentation.

Saphenofemoral Junction: No evidence of thrombus. Normal
compressibility and flow on color Doppler imaging.

Profunda Femoral Vein: No evidence of thrombus. Normal
compressibility and flow on color Doppler imaging.

Femoral Vein: No evidence of thrombus. Normal compressibility,
respiratory phasicity and response to augmentation.

Popliteal Vein: No evidence of thrombus. Normal compressibility,
respiratory phasicity and response to augmentation.

Calf Veins: No evidence of thrombus. Normal compressibility and flow
on color Doppler imaging.

Other Findings:  Atherosclerotic calcifications.
IMPRESSION: No evidence of deep venous thrombosis.

## 2015-05-28 IMAGING — CT CT ABD-PELV W/O CM
2 of 4 series · 15 of 46 positions shown, 17 images · non-contrast
Comparison: No similar prior exam is available at this institution
for comparison or on [HOSPITAL] PACS.

CLINICAL DATA: Weakness, diarrhea

EXAM:
CT ABDOMEN AND PELVIS WITHOUT CONTRAST
TECHNIQUE: Multidetector CT imaging of the abdomen and pelvis was performed
following the standard protocol without IV contrast.

[Series 2: routine abd pel without · axial · non-contrast · 0.65mm/px · z∈[-90,+300]mm · 12 of 86 slices shown, 14 images]
[im 4/86  soft-tissue]
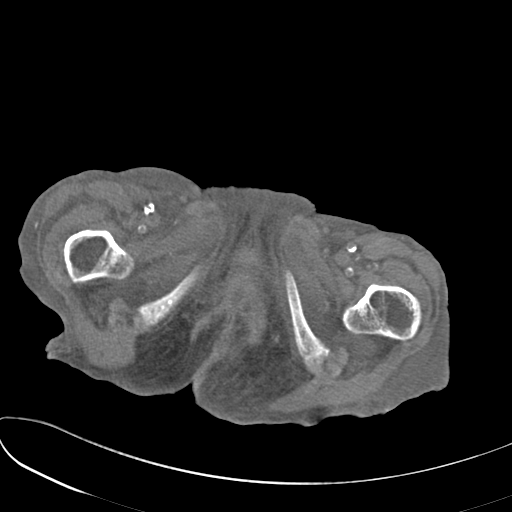
[im 4/86  bone]
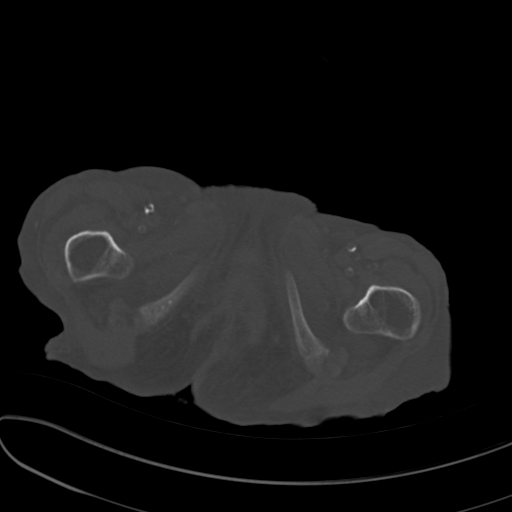
[im 12/86  soft-tissue]
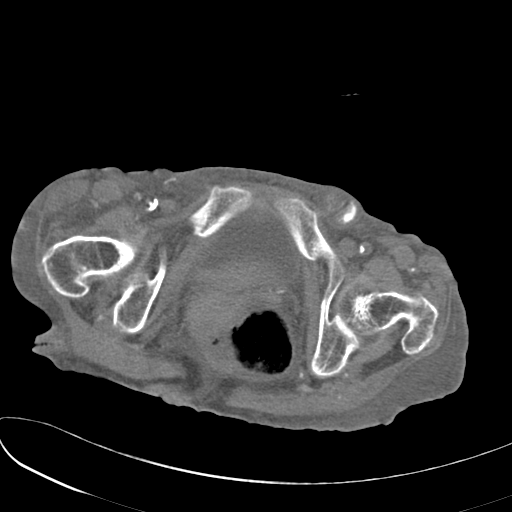
[im 19/86  soft-tissue]
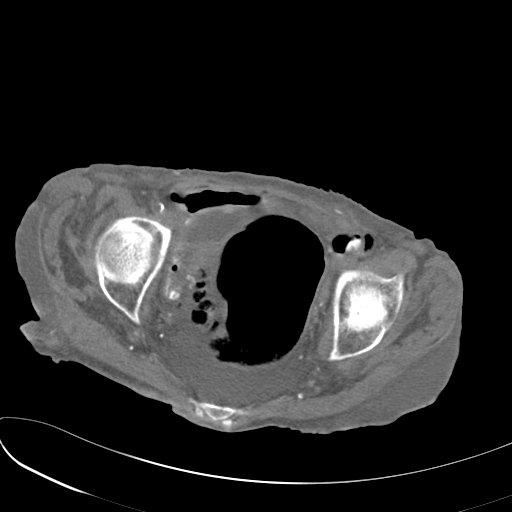
[im 26/86  soft-tissue]
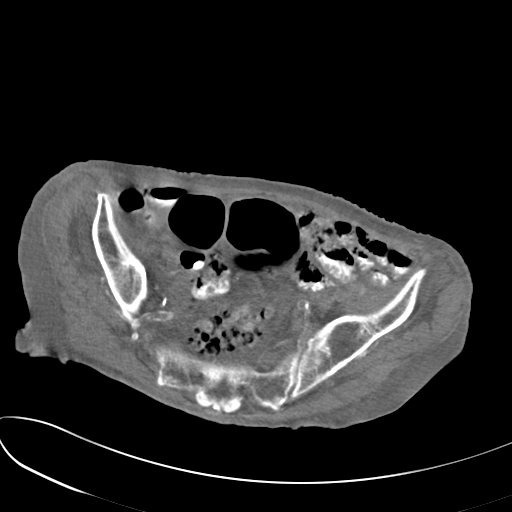
[im 34/86  soft-tissue]
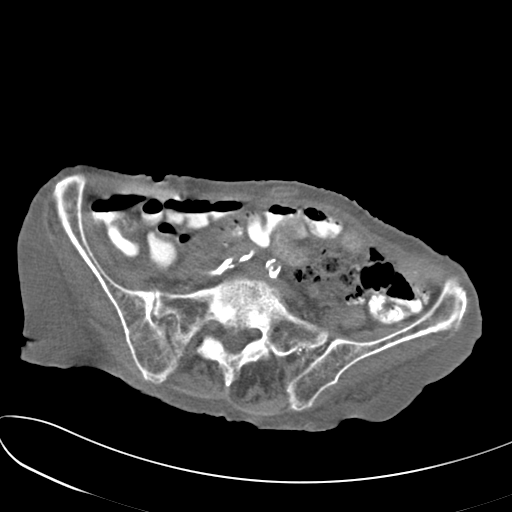
[im 41/86  soft-tissue]
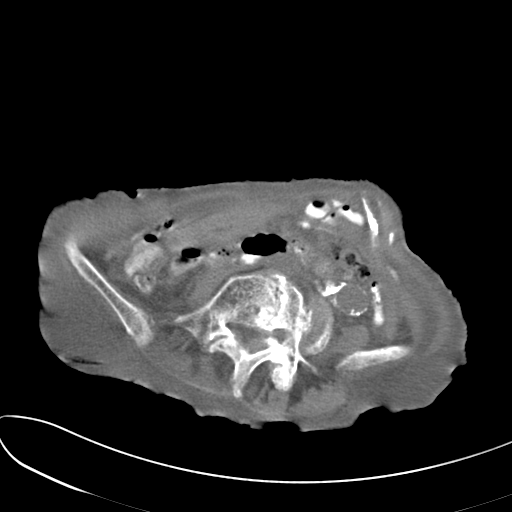
[im 45/86  soft-tissue]
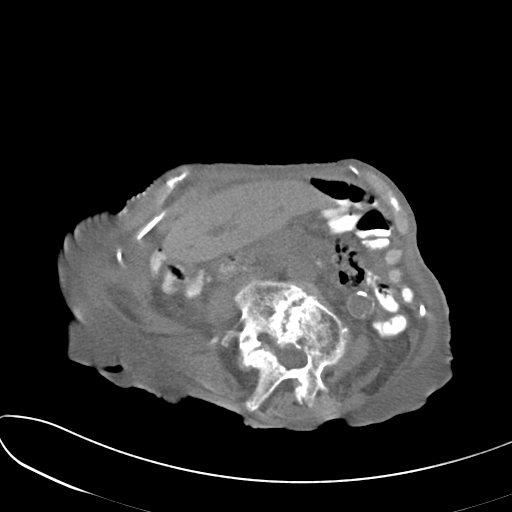
[im 52/86  soft-tissue]
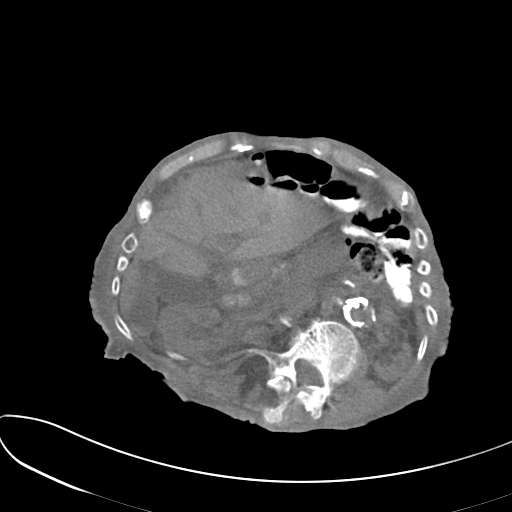
[im 60/86  soft-tissue]
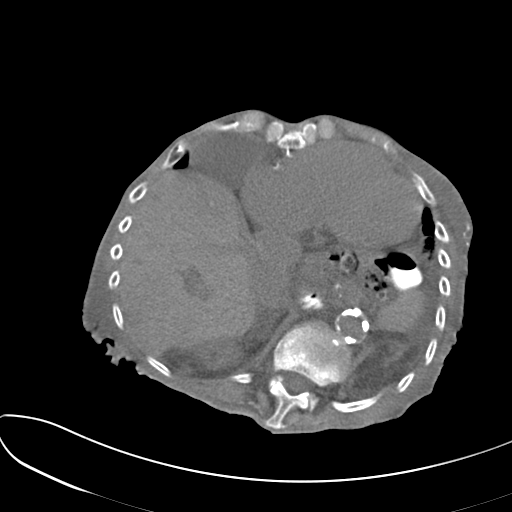
[im 60/86  bone]
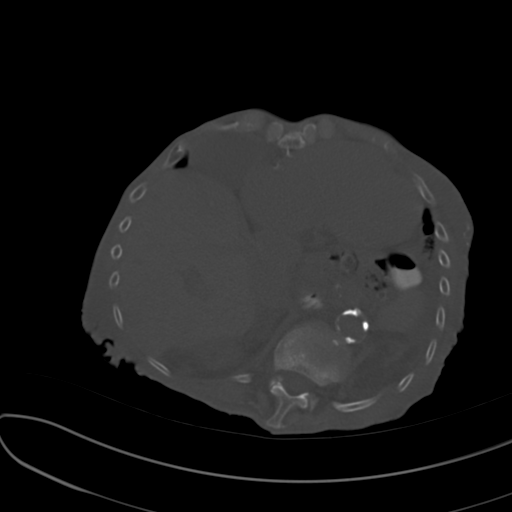
[im 67/86  soft-tissue]
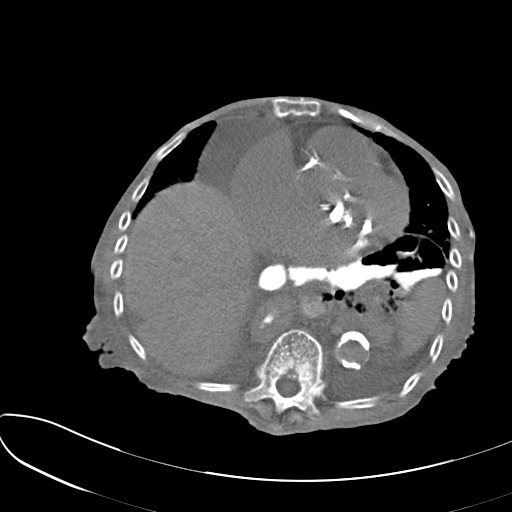
[im 74/86  soft-tissue]
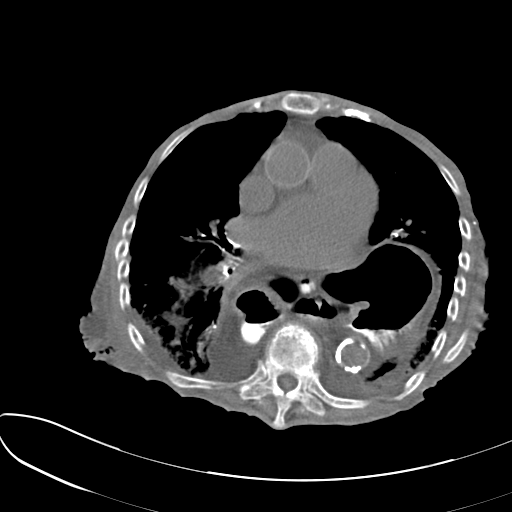
[im 82/86  soft-tissue]
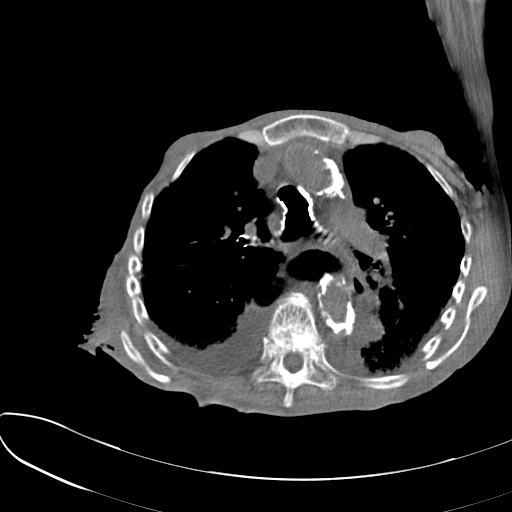

[Series 5: cor routine abd pel wo · coronal · 0.61mm/px · 3 of 110 slices shown]
[im 37/110  soft-tissue]
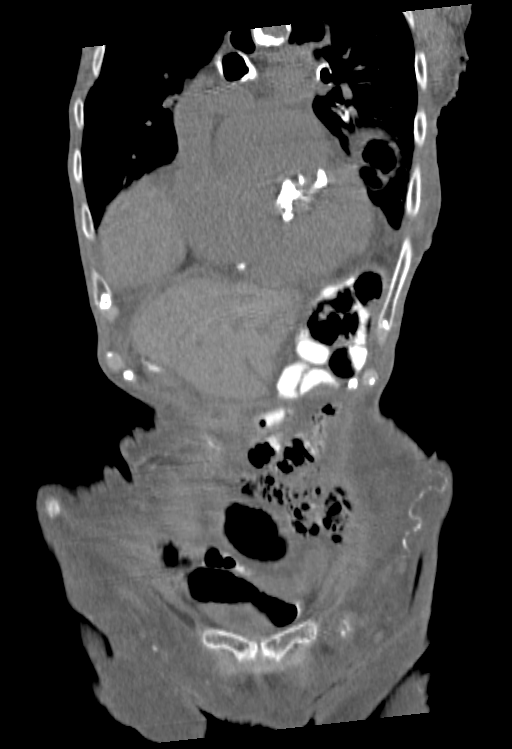
[im 49/110  soft-tissue]
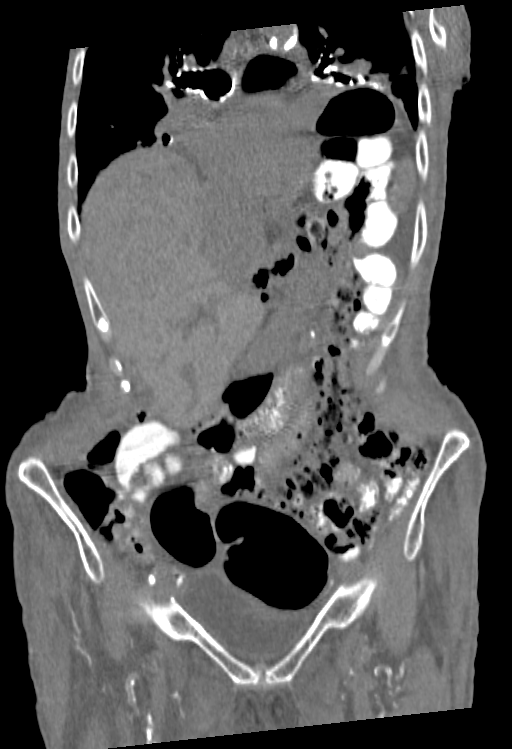
[im 61/110  soft-tissue]
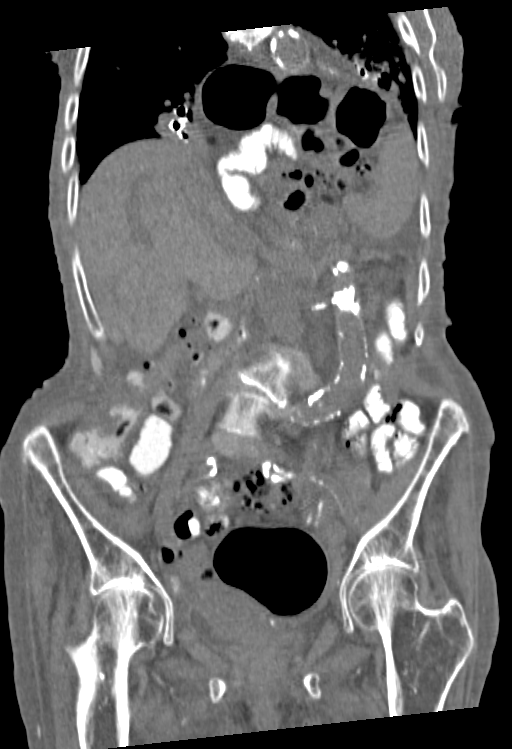

[15 of 46 positions shown; findings below may reference images not displayed]

FINDINGS: Lower chest: Small pleural effusions are noted with nonspecific
subpleural patchy airspace opacities which could represent
atelectasis but are otherwise nonspecific. Large hiatal hernia.
Central bronchial wall thickening may indicate bronchitis. Small
amount of pericardial fluid is partly visualized. Left atrial
enlargement incidentally noted.

Hepatobiliary: Unenhanced liver is grossly unremarkable. Gallbladder
is normal in appearance.

Pancreas: Not visualized

Spleen: Normal

Adrenals/Urinary Tract: Right adrenal gland appears unremarkable.
Left adrenal gland is not visualized. Left renal atrophy is present.
The right renal contour appears loculated but visualization is
extremely suboptimal due to lack of contrast, ascites, and patient
body habitus and definable internal renal cortical masses could be
obscured. No gross evidence for hydroureteronephrosis.

Stomach/Bowel: No gross evidence for bowel wall thickening or focal
segmental dilatation. Appendix appears normal, image 57.

Vascular/Lymphatic: Dense atheromatous aortic calcification with
aortic tortuosity but no aneurysm identified.

Other: Coarse uterine calcifications may indicate underlying
fibroids, not distinctly identifiable. Neither ovary is seen. No
adnexal mass. Moderate free pelvic fluid is identified measuring low
density compatible a simple fluid. Small fat containing umbilical
hernia is identified. Mild soft tissue edema is noted.

Musculoskeletal: Bones are subjectively osteopenic. Severe leftward
scoliosis of the thoracolumbar spine is noted. No vertebral body
compression fracture is identified.
IMPRESSION: Suboptimal exam due to lack of IV contrast and patient thin body
habitus. Ascites is visualized which is nonspecific but may indicate
occult bowel pathology. Presence of soft tissue edema may indicate
hypoproteinemia.

No focal acute intra-abdominal or pelvic pathology.

Small pleural effusions with presumed subpleural atelectasis.
